# Patient Record
Sex: Male | Born: 1960 | Race: White | Hispanic: No | Marital: Married | Smoking: Never smoker
Health system: Southern US, Community
[De-identification: ages and names within clinical notes are randomized; demographics above are authoritative.]

## PROBLEM LIST (undated history)

## (undated) DIAGNOSIS — R112 Nausea with vomiting, unspecified: Secondary | ICD-10-CM

## (undated) DIAGNOSIS — Z9889 Other specified postprocedural states: Secondary | ICD-10-CM

## (undated) DIAGNOSIS — E119 Type 2 diabetes mellitus without complications: Secondary | ICD-10-CM

## (undated) DIAGNOSIS — I2510 Atherosclerotic heart disease of native coronary artery without angina pectoris: Secondary | ICD-10-CM

## (undated) DIAGNOSIS — I10 Essential (primary) hypertension: Secondary | ICD-10-CM

## (undated) DIAGNOSIS — E7800 Pure hypercholesterolemia, unspecified: Secondary | ICD-10-CM

## (undated) HISTORY — PX: CHOLECYSTECTOMY: SHX55

## (undated) HISTORY — PX: BACK SURGERY: SHX140

---

## 1997-09-28 ENCOUNTER — Ambulatory Visit (HOSPITAL_COMMUNITY): Admission: RE | Admit: 1997-09-28 | Discharge: 1997-09-28 | Payer: Self-pay | Admitting: Neurosurgery

## 1998-05-29 ENCOUNTER — Inpatient Hospital Stay (HOSPITAL_COMMUNITY): Admission: AD | Admit: 1998-05-29 | Discharge: 1998-05-30 | Payer: Self-pay | Admitting: Cardiology

## 1998-05-30 ENCOUNTER — Encounter: Payer: Self-pay | Admitting: Cardiology

## 1999-03-25 ENCOUNTER — Encounter: Admission: RE | Admit: 1999-03-25 | Discharge: 1999-03-25 | Payer: Self-pay | Admitting: Urology

## 1999-03-25 ENCOUNTER — Encounter: Payer: Self-pay | Admitting: Urology

## 2002-10-24 ENCOUNTER — Ambulatory Visit (HOSPITAL_COMMUNITY): Admission: RE | Admit: 2002-10-24 | Discharge: 2002-10-24 | Payer: Self-pay | Admitting: Internal Medicine

## 2002-10-24 ENCOUNTER — Encounter: Payer: Self-pay | Admitting: Internal Medicine

## 2002-10-31 ENCOUNTER — Ambulatory Visit (HOSPITAL_COMMUNITY): Admission: RE | Admit: 2002-10-31 | Discharge: 2002-10-31 | Payer: Self-pay | Admitting: Internal Medicine

## 2004-03-06 ENCOUNTER — Ambulatory Visit: Payer: Self-pay | Admitting: Family Medicine

## 2004-03-18 ENCOUNTER — Ambulatory Visit: Payer: Self-pay | Admitting: Gastroenterology

## 2004-03-21 ENCOUNTER — Ambulatory Visit: Payer: Self-pay | Admitting: Gastroenterology

## 2004-04-08 ENCOUNTER — Ambulatory Visit: Payer: Self-pay | Admitting: Family Medicine

## 2004-06-10 ENCOUNTER — Ambulatory Visit: Payer: Self-pay | Admitting: Family Medicine

## 2004-07-24 ENCOUNTER — Ambulatory Visit: Payer: Self-pay | Admitting: Family Medicine

## 2004-09-04 ENCOUNTER — Ambulatory Visit: Payer: Self-pay | Admitting: Family Medicine

## 2004-09-05 ENCOUNTER — Ambulatory Visit (HOSPITAL_COMMUNITY): Admission: RE | Admit: 2004-09-05 | Discharge: 2004-09-05 | Payer: Self-pay | Admitting: Urology

## 2004-09-22 ENCOUNTER — Ambulatory Visit: Payer: Self-pay | Admitting: Family Medicine

## 2004-11-17 ENCOUNTER — Ambulatory Visit: Payer: Self-pay | Admitting: Family Medicine

## 2005-01-19 ENCOUNTER — Ambulatory Visit: Payer: Self-pay | Admitting: Family Medicine

## 2005-04-09 ENCOUNTER — Ambulatory Visit: Payer: Self-pay | Admitting: Family Medicine

## 2005-04-13 ENCOUNTER — Ambulatory Visit: Payer: Self-pay | Admitting: Family Medicine

## 2005-05-20 ENCOUNTER — Ambulatory Visit: Payer: Self-pay | Admitting: Family Medicine

## 2005-07-22 ENCOUNTER — Ambulatory Visit: Payer: Self-pay | Admitting: Family Medicine

## 2005-09-16 ENCOUNTER — Ambulatory Visit: Payer: Self-pay | Admitting: Family Medicine

## 2005-11-03 ENCOUNTER — Ambulatory Visit: Payer: Self-pay | Admitting: Family Medicine

## 2006-02-15 ENCOUNTER — Ambulatory Visit: Payer: Self-pay | Admitting: Family Medicine

## 2006-03-11 ENCOUNTER — Ambulatory Visit: Payer: Self-pay | Admitting: Family Medicine

## 2006-04-12 ENCOUNTER — Ambulatory Visit: Payer: Self-pay | Admitting: Family Medicine

## 2006-05-31 ENCOUNTER — Ambulatory Visit: Payer: Self-pay | Admitting: Family Medicine

## 2009-07-02 ENCOUNTER — Emergency Department (HOSPITAL_COMMUNITY): Admission: EM | Admit: 2009-07-02 | Discharge: 2009-07-02 | Payer: Self-pay | Admitting: Emergency Medicine

## 2010-07-27 LAB — URINALYSIS, ROUTINE W REFLEX MICROSCOPIC
Bilirubin Urine: NEGATIVE
Glucose, UA: NEGATIVE mg/dL
Ketones, ur: NEGATIVE mg/dL
Leukocytes, UA: NEGATIVE
Nitrite: NEGATIVE
pH: 7 (ref 5.0–8.0)

## 2010-07-27 LAB — DIFFERENTIAL
Basophils Absolute: 0 10*3/uL (ref 0.0–0.1)
Basophils Relative: 0 % (ref 0–1)
Lymphocytes Relative: 14 % (ref 12–46)
Monocytes Absolute: 0.5 10*3/uL (ref 0.1–1.0)
Neutro Abs: 6.8 10*3/uL (ref 1.7–7.7)
Neutrophils Relative %: 79 % — ABNORMAL HIGH (ref 43–77)

## 2010-07-27 LAB — COMPREHENSIVE METABOLIC PANEL
ALT: 25 U/L (ref 0–53)
AST: 27 U/L (ref 0–37)
Albumin: 4.2 g/dL (ref 3.5–5.2)
CO2: 28 mEq/L (ref 19–32)
Creatinine, Ser: 0.92 mg/dL (ref 0.4–1.5)
GFR calc Af Amer: >60 mL/min (ref >60)
Glucose, Bld: 181 mg/dL — ABNORMAL HIGH (ref 70–99)
Total Bilirubin: 1.3 mg/dL — ABNORMAL HIGH (ref 0.3–1.2)

## 2010-07-27 LAB — URINE MICROSCOPIC-ADD ON

## 2010-07-27 LAB — CBC
HCT: 44.7 % (ref 39.0–52.0)
Hemoglobin: 15.4 g/dL (ref 13.0–17.0)
RBC: 4.76 MIL/uL (ref 4.22–5.81)
WBC: 8.6 10*3/uL (ref 4.0–10.5)

## 2010-09-19 NOTE — Op Note (Signed)
NAME:  Kerry Pollard, Kerry Pollard                          ACCOUNT NO.:  [id number]   MEDICAL RECORD NO.:  [id number]                   PATIENT TYPE:  AMB   LOCATION:  DAY                                  FACILITY:  [facility]   PHYSICIAN:  R. Roetta Sessions, M.D.              DATE OF BIRTH:  January 30, 1961   DATE OF PROCEDURE:  10/31/2002  DATE OF DISCHARGE:                                 OPERATIVE REPORT   PROCEDURE:  Esophagogastroduodenoscopy with biopsy.   INDICATIONS FOR PROCEDURE:  The patient is a 50 year old gentleman with  right upper quadrant abdominal pain x1 year with some exacerbation by  movement.  Moderately elevated SGPT recently.  Ultrasound demonstrated a  fatty appearing liver.  No other hepatobiliary abnormalities.  Iron studies  showed ferratin to be within normal limits.  Hepatitis C and B markers  negative.  EGD is now being done to further evaluate his symptoms.  This  approach has been discussed with the patient at length previously.  The  potential risks, benefits, and alternatives have been reviewed.  Please see  my dictated 10/23/2002 office note attached to the chart.   PROCEDURE:  O2 saturation, blood pressure, pulses, and respirations were  monitored throughout the entire procedure.  Conscious sedation was with  Versed 4 mg IV, Demerol 100 mg IV in divided doses.  The instrument used was  the Olympus video chip adult gastroscope, Cetacaine spray for topical  oropharyngeal anesthesia.   FINDINGS:  Examination of the tubular esophagus revealed no abnormalities.  The EG junction was somewhat patulous and easily traversed.   Stomach:  The gastric cavity was empty and insufflated well with air.  Thorough examination of the gastric mucosa including retroflex view in the  proximal stomach and esophagogastric junction demonstrated a small hiatal  hernia and multiple antral erosions.  There was no obvious infiltrative  process or frank ulcer.  The pylorus was patent and easily  traversed.   Duodenum:  Examination of the bulb and second portion revealed no  abnormalities.   THERAPEUTIC/DIAGNOSTIC MANEUVERS PERFORMED:  Antral biopsies for histologic  study were taken.  The patient tolerated the procedure well and was reactive  in endoscopy.   IMPRESSION:  1. A somewhat patulous esophagogastric junction and normal esophageal     mucosa.  2. Small hiatal hernia.  3. Multiple antral erosions, as described above, biopsied.  The remainder of     the gastric mucosa appeared normal.  4. Normal first and second portions of the duodenum.  5. Today's findings may be secondary to aspirin effect.  He has had chronic     symptoms for some time and are somewhat atypical in nature.   RECOMMENDATIONS:  1. I will ask him to stop the Nexium and give him a three-week sample supply     of Aciphex 20 mg orally once daily (just to see if this makes a  difference in the abdominal pain).  2.     Draw H pylori serologies.  3. Follow up in the office in three to four weeks.  4. If his abdominal pain is persisting at his next office visit, will     consider gallbladder evaluation via HIDA scan.                                               [name], M.D.    RMR/MEDQ  D:  10/31/2002  T:  10/31/2002  Job:  [id number]   cc:   Delaney Meigs, M.D.  [address]  [place]  [place] [address]  Fax: [phone]

## 2010-10-03 IMAGING — CR DG CERVICAL SPINE COMPLETE 4+V
7 series · 7 of 7 positions shown · non-contrast
Comparison: None.

CLINICAL DATA: History of trauma and pain

CERVICAL SPINE - COMPLETE 4+ VIEW

[view not recorded (1 of 7)]
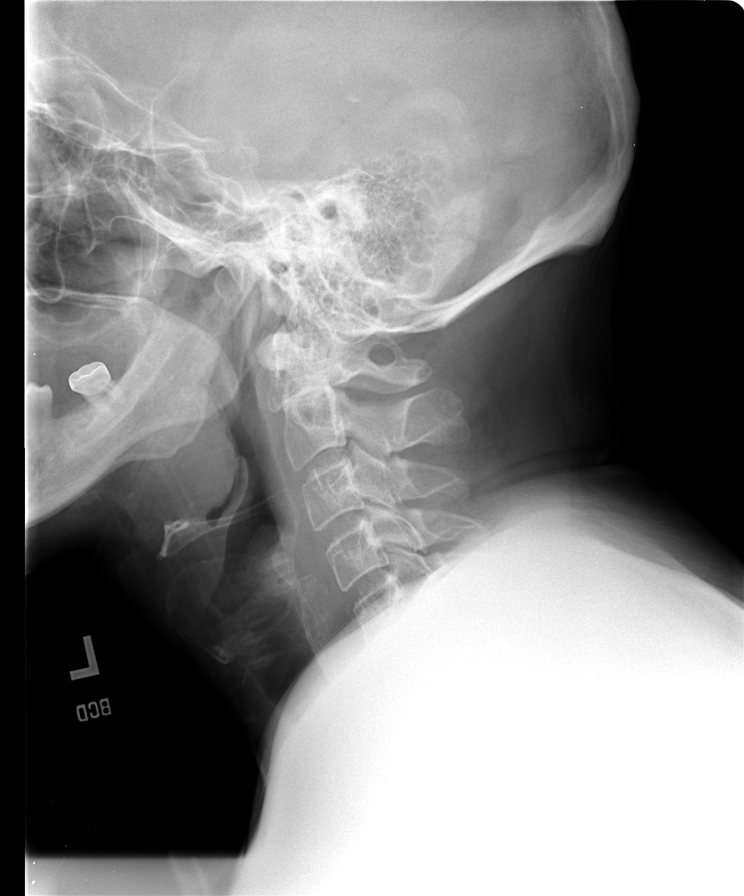

[view not recorded (2 of 7)]
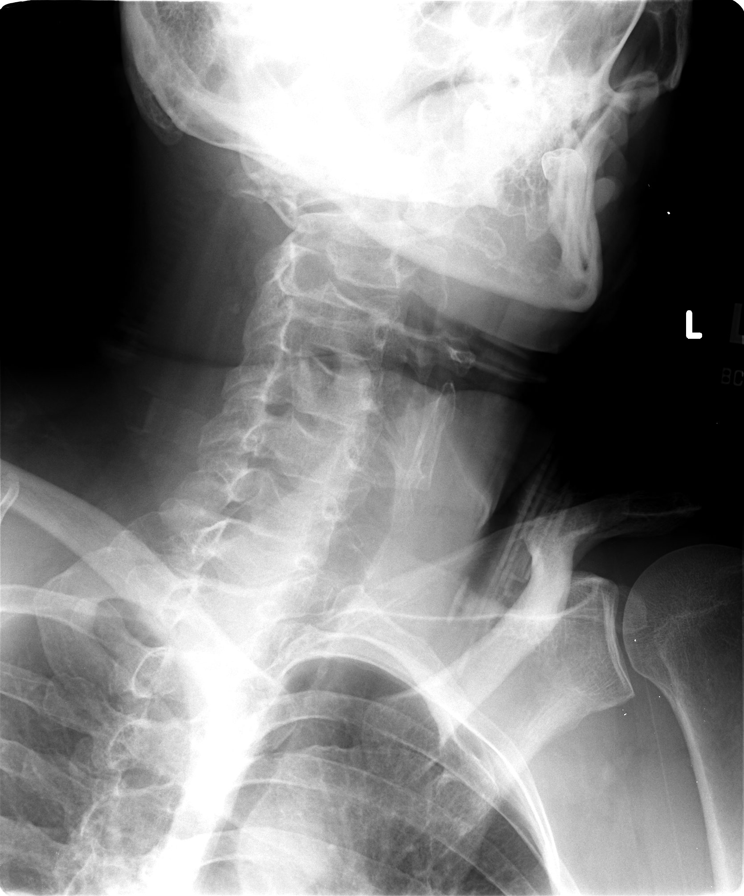

[view not recorded (3 of 7)]
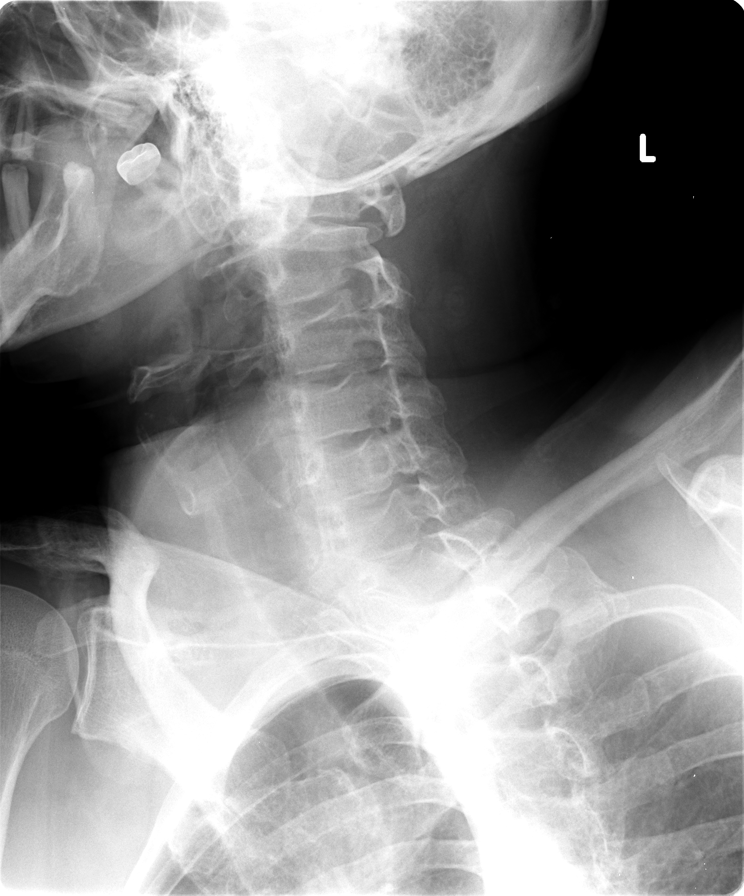

[view not recorded (4 of 7)]
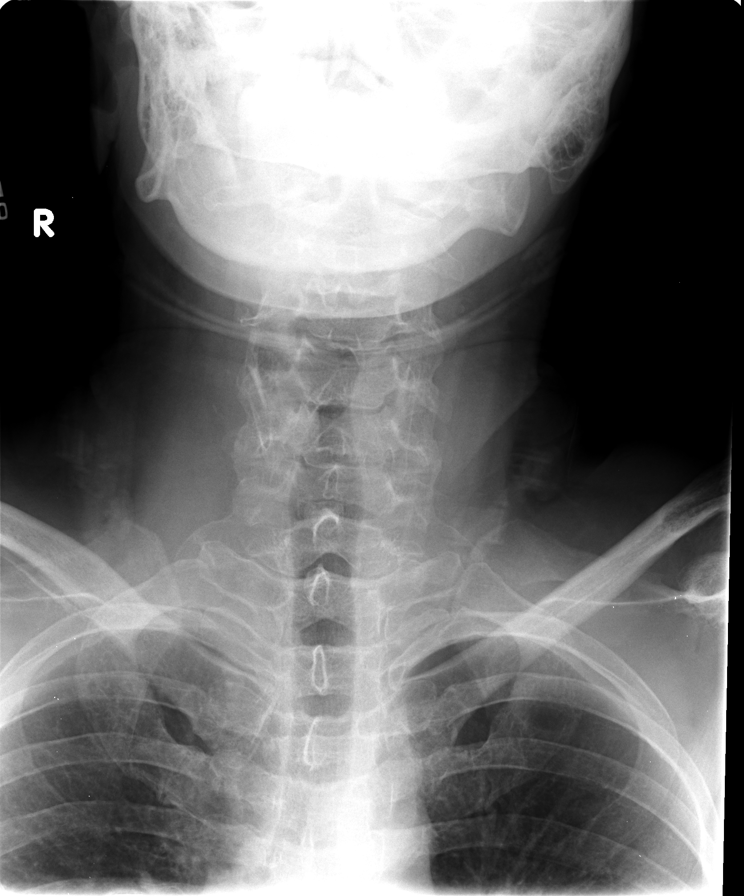

[view not recorded (5 of 7)]
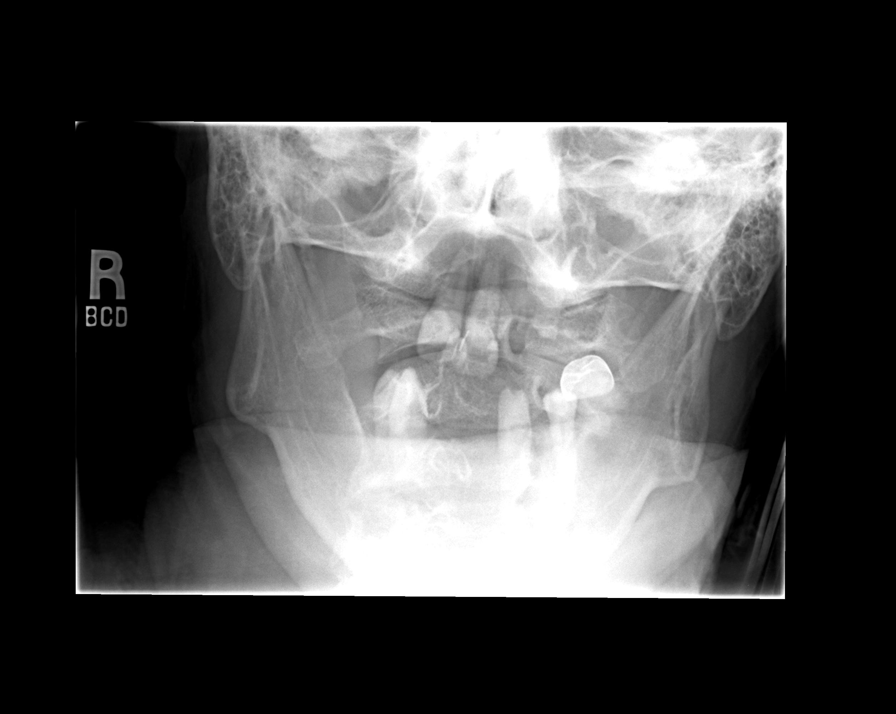

[view not recorded (6 of 7)]
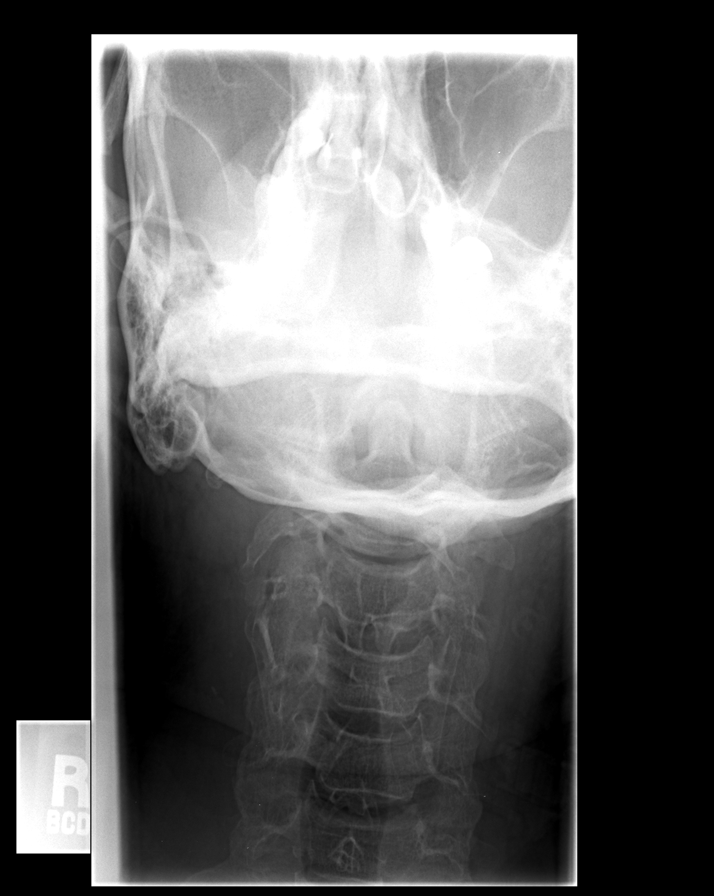

[view not recorded (7 of 7)]
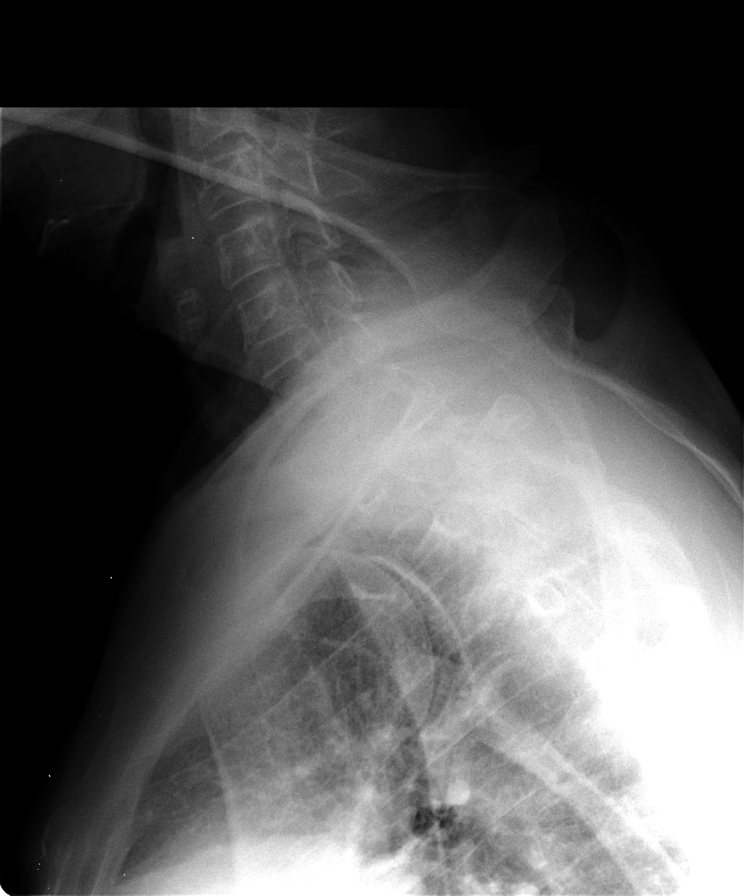

[7 of 7 positions shown; findings below may reference images not displayed]

FINDINGS: No prevertebral soft tissue swelling is evident.
Intervertebral disc spaces are maintained.  No fracture or
subluxation or spondylosis is seen.  Foramina appear patent.
IMPRESSION: No cervical trauma is evident.

## 2012-02-12 ENCOUNTER — Emergency Department (HOSPITAL_COMMUNITY)
Admission: EM | Admit: 2012-02-12 | Discharge: 2012-02-12 | Disposition: A | Discharge status: Home / Self Care | Payer: BC Managed Care – PPO | Attending: Emergency Medicine | Admitting: Emergency Medicine

## 2012-02-12 ENCOUNTER — Encounter (HOSPITAL_COMMUNITY): Payer: Self-pay | Admitting: Emergency Medicine

## 2012-02-12 DIAGNOSIS — H81399 Other peripheral vertigo, unspecified ear: Secondary | ICD-10-CM | POA: Insufficient documentation

## 2012-02-12 DIAGNOSIS — Z886 Allergy status to analgesic agent status: Secondary | ICD-10-CM | POA: Insufficient documentation

## 2012-02-12 DIAGNOSIS — R739 Hyperglycemia, unspecified: Secondary | ICD-10-CM

## 2012-02-12 DIAGNOSIS — E7800 Pure hypercholesterolemia, unspecified: Secondary | ICD-10-CM | POA: Insufficient documentation

## 2012-02-12 DIAGNOSIS — I10 Essential (primary) hypertension: Secondary | ICD-10-CM | POA: Insufficient documentation

## 2012-02-12 DIAGNOSIS — Z880 Allergy status to penicillin: Secondary | ICD-10-CM | POA: Insufficient documentation

## 2012-02-12 DIAGNOSIS — I2510 Atherosclerotic heart disease of native coronary artery without angina pectoris: Secondary | ICD-10-CM | POA: Insufficient documentation

## 2012-02-12 DIAGNOSIS — E119 Type 2 diabetes mellitus without complications: Secondary | ICD-10-CM | POA: Insufficient documentation

## 2012-02-12 HISTORY — DX: Pure hypercholesterolemia, unspecified: E78.00

## 2012-02-12 HISTORY — DX: Type 2 diabetes mellitus without complications: E11.9

## 2012-02-12 HISTORY — DX: Essential (primary) hypertension: I10

## 2012-02-12 HISTORY — DX: Atherosclerotic heart disease of native coronary artery without angina pectoris: I25.10

## 2012-02-12 LAB — CBC WITH DIFFERENTIAL/PLATELET
HCT: 45.3 % (ref 39.0–52.0)
Hemoglobin: 16.4 g/dL (ref 13.0–17.0)
Lymphocytes Relative: 17 % (ref 12–46)
Lymphs Abs: 1.6 10*3/uL (ref 0.7–4.0)
MCHC: 36.2 g/dL — ABNORMAL HIGH (ref 30.0–36.0)
MCV: 88 fL (ref 78.0–100.0)
Monocytes Absolute: 0.3 10*3/uL (ref 0.1–1.0)
Neutrophils Relative %: 79 % — ABNORMAL HIGH (ref 43–77)
Platelets: 279 10*3/uL (ref 150–400)
RBC: 5.15 MIL/uL (ref 4.22–5.81)
WBC: 9.3 10*3/uL (ref 4.0–10.5)

## 2012-02-12 LAB — GLUCOSE, CAPILLARY: Glucose-Capillary: 365 mg/dL — ABNORMAL HIGH (ref 70–99)

## 2012-02-12 LAB — BASIC METABOLIC PANEL
BUN: 12 mg/dL (ref 6–23)
CO2: 27 mEq/L (ref 19–32)
Calcium: 10.5 mg/dL (ref 8.4–10.5)
Creatinine, Ser: 0.81 mg/dL (ref 0.50–1.35)
GFR calc non Af Amer: >90 mL/min (ref >90)
Glucose, Bld: 343 mg/dL — ABNORMAL HIGH (ref 70–99)
Sodium: 133 mEq/L — ABNORMAL LOW (ref 135–145)

## 2012-02-12 MED ORDER — SODIUM CHLORIDE 0.9 % IV BOLUS (SEPSIS)
1000.0000 mL | Freq: Once | INTRAVENOUS | Status: AC
  Administered 2012-02-12 (×2): 1000 mL via INTRAVENOUS

## 2012-02-12 MED ORDER — MECLIZINE HCL 25 MG PO TABS: 25.0000 mg | ORAL_TABLET | Freq: Three times a day (TID) | ORAL | Status: AC | PRN

## 2012-02-12 MED ORDER — MECLIZINE HCL 12.5 MG PO TABS
25.0000 mg | ORAL_TABLET | Freq: Once | ORAL | Status: AC
  Administered 2012-02-12: 25 mg via ORAL
  Filled 2012-02-12: qty 2

## 2012-02-12 MED ORDER — ONDANSETRON HCL 4 MG/2ML IJ SOLN
4.0000 mg | Freq: Once | INTRAMUSCULAR | Status: AC
  Administered 2012-02-12: 4 mg via INTRAVENOUS
  Filled 2012-02-12: qty 2

## 2012-02-12 MED ORDER — SODIUM CHLORIDE 0.9 % IV SOLN
Freq: Once | INTRAVENOUS | Status: AC
  Administered 2012-02-12: 1000 mL via INTRAVENOUS

## 2012-02-12 NOTE — ED Notes (Signed)
Pt woke around 4am with n/v. Went to see pcp and bs checked at office and was 358. 365 now. Pt c/o dizziness also. Pt c/o h/a. Alert/oriented. gen weakness observed. Mm slightly wet.

## 2012-02-12 NOTE — ED Provider Notes (Signed)
History    This chart was scribed for Kerry Booze, MD, MD by Smitty Pluck. The patient was seen in room [facility]10 and the patient's care was started at 10:47AM.   CSN: [id number]  Arrival date & time 02/12/12  1008      Chief Complaint  Patient presents with  . Hyperglycemia  . Emesis    (Consider location/radiation/quality/duration/timing/severity/associated sxs/prior treatment) Patient is a 51 y.o. male presenting with vomiting. The history is provided by the patient. No language interpreter was used.  Emesis    Kerry Pollard is a 51 y.o. male who presents to the Emergency Department complaining of constant, moderate nausea, emesis and dizziness onset today this AM. Pt reports that he went to PCP PTA and was told he had elevated glucose of 358 and DKA. He reports movement aggravates his dizziness. He reports he check his glucose at home yesterday and it was 270. He reports having headache. Denies trouble hearing, ringing in ears, smoking and drinking.  Past Medical History  Diagnosis Date  . Diabetes mellitus without complication   . Coronary artery disease   . Hypertension   . Hypercholesterolemia     Past Surgical History  Procedure Date  . Cholecystectomy   . Back surgery     History reviewed. No pertinent family history.  History  Substance Use Topics  . Smoking status: Never Smoker   . Smokeless tobacco: Not on file  . Alcohol Use: No      Review of Systems  Gastrointestinal: Positive for vomiting.  All other systems reviewed and are negative.    Allergies  Codeine and Penicillins  Home Medications   Current Outpatient Rx  Name Route Sig Dispense Refill  . ASPIRIN 81 MG PO CHEW Oral Chew 81 mg by mouth daily.    . ATENOLOL 25 MG PO TABS Oral Take 25 mg by mouth daily.    Marland Kitchen ESOMEPRAZOLE MAGNESIUM 40 MG PO CPDR Oral Take 40 mg by mouth daily before breakfast.    . GABAPENTIN 100 MG PO CAPS Oral Take 100 mg by mouth 3 (three) times daily.    Marland Kitchen  GLIPIZIDE ER 10 MG PO TB24 Oral Take 10 mg by mouth daily.    Marland Kitchen HYDROCHLOROTHIAZIDE 25 MG PO TABS Oral Take 25 mg by mouth daily.    . INSULIN DETEMIR 100 UNIT/ML [place] SOLN Subcutaneous Inject 75 Units into the skin at bedtime.    Marland Kitchen METFORMIN HCL 1000 MG PO TABS Oral Take 1,000 mg by mouth 2 (two) times daily with a meal. Take 1000 mg in the morning and 2000 mg at night    . RAMIPRIL 10 MG PO CAPS Oral Take 10 mg by mouth daily.    Marland Kitchen ROSUVASTATIN CALCIUM 10 MG PO TABS Oral Take 10 mg by mouth daily.      BP 151/82  Pulse 67  Temp 97.3 F (36.3 C) (Oral)  Resp 18  SpO2 94%  Physical Exam  Nursing note and vitals reviewed. Constitutional: He is oriented to person, place, and time. He appears well-developed and well-nourished. No distress.  HENT:  Head: Normocephalic and atraumatic.  Eyes: EOM are normal. Pupils are equal, round, and reactive to light.  Neck: Normal range of motion. Neck supple. No tracheal deviation present.  Cardiovascular: Normal rate.   Pulmonary/Chest: Effort normal. No respiratory distress.  Abdominal: Soft. He exhibits no distension.  Musculoskeletal: Normal range of motion.  Neurological: He is alert and oriented to person, place, and time.  No nystagmus  Dizziness reproduced by head movement   Skin: Skin is warm and dry.  Psychiatric: He has a normal mood and affect. His behavior is normal.    ED Course  Procedures (including critical care time) DIAGNOSTIC STUDIES: Oxygen Saturation is 94% on room air, normal by my interpretation.    COORDINATION OF CARE: 10:50 AM Discussed ED treatment with pt  11:40 AM Ordered:  zofran 4 mg, anitvert 25 mg, 0.9% NaCl infusion   Results for orders placed during the hospital encounter of 02/12/12  CBC WITH DIFFERENTIAL      Component Value Range   WBC 9.3  4.0 - 10.5 K/uL   RBC 5.15  4.22 - 5.81 MIL/uL   Hemoglobin 16.4  13.0 - 17.0 g/dL   HCT 86.5  78.4 - 69.6 %   MCV 88.0  78.0 - 100.0 fL   MCH 31.8   26.0 - 34.0 pg   MCHC 36.2 (*) 30.0 - 36.0 g/dL   RDW 29.5  28.4 - 13.2 %   Platelets 279  150 - 400 K/uL   Neutrophils Relative 79 (*) 43 - 77 %   Neutro Abs 7.4  1.7 - 7.7 K/uL   Lymphocytes Relative 17  12 - 46 %   Lymphs Abs 1.6  0.7 - 4.0 K/uL   Monocytes Relative 3  3 - 12 %   Monocytes Absolute 0.3  0.1 - 1.0 K/uL   Eosinophils Relative 0  0 - 5 %   Eosinophils Absolute 0.0  0.0 - 0.7 K/uL   Basophils Relative 0  0 - 1 %   Basophils Absolute 0.0  0.0 - 0.1 K/uL  BASIC METABOLIC PANEL      Component Value Range   Sodium 133 (*) 135 - 145 mEq/L   Potassium 4.1  3.5 - 5.1 mEq/L   Chloride 94 (*) 96 - 112 mEq/L   CO2 27  19 - 32 mEq/L   Glucose, Bld 343 (*) 70 - 99 mg/dL   BUN 12  6 - 23 mg/dL   Creatinine, Ser 4.40  0.50 - 1.35 mg/dL   Calcium 10.2  8.4 - 72.5 mg/dL   GFR calc non Af Amer >90  >90 mL/min   GFR calc Af Amer >90  >90 mL/min  GLUCOSE, CAPILLARY      Component Value Range   Glucose-Capillary 365 (*) 70 - 99 mg/dL   Comment 1 Documented in Chart     Comment 2 Notify RN    GLUCOSE, CAPILLARY      Component Value Range   Glucose-Capillary 275 (*) 70 - 99 mg/dL     1. Peripheral vertigo   2. Hyperglycemia       MDM  Dizziness which seems to be peripheral vertigo. Hyperglycemia is relatively mild and I do not see any clinical evidence of ketoacidosis. He will be given IV hydration and electrolytes checked. Initial dose of meclizine is given.  He got good relief of dizziness with meclizine. Blood sugars come down to Tuesday 175 without exogenous insulin. He will be sent home with a prescription for meclizine.   I personally performed the services described in this documentation, which was scribed in my presence. The recorded information has been reviewed and considered.        Kerry Booze, MD 02/12/12 1228

## 2013-09-07 ENCOUNTER — Telehealth: Payer: Self-pay | Admitting: Internal Medicine

## 2013-09-07 NOTE — Telephone Encounter (Signed)
Pt called to set up his colonoscopy. Last one was in 2004. Patient can be reached after 4pm. [phone]

## 2013-09-12 ENCOUNTER — Other Ambulatory Visit: Payer: Self-pay

## 2013-09-12 DIAGNOSIS — Z1211 Encounter for screening for malignant neoplasm of colon: Secondary | ICD-10-CM

## 2013-09-12 NOTE — Telephone Encounter (Signed)
Day of bowel prep: Invokana 150mg  in AM, glipizide 5mg  daily, metformin 250mg  in am and 500mg  pm.

## 2013-09-12 NOTE — Telephone Encounter (Signed)
Gastroenterology Pre-Procedure Review  Request Date: 09/12/2013 Requesting Physician:   PATIENT REVIEW QUESTIONS: The patient responded to the following health history questions as indicated:    1. Diabetes Melitis: YES 2. Joint replacements in the past 12 months: no 3. Major health problems in the past 3 months: no 4. Has an artificial valve or MVP: no 5. Has a defibrillator: no 6. Has been advised in past to take antibiotics in advance of a procedure like teeth cleaning: no    MEDICATIONS & ALLERGIES:    Patient reports the following regarding taking any blood thinners:   Plavix? no Aspirin? YES Coumadin? no  Patient confirms/reports the following medications:  Current Outpatient Prescriptions  Medication Sig Dispense Refill  . aspirin 81 MG chewable tablet Chew 81 mg by mouth daily.      Marland Kitchen atenolol (TENORMIN) 25 MG tablet Take 25 mg by mouth daily.      . Canagliflozin (INVOKANA) 300 MG TABS Take 300 mg by mouth daily. Once daily in the AM      . esomeprazole (NEXIUM) 40 MG capsule Take 40 mg by mouth daily before breakfast.      . gabapentin (NEURONTIN) 100 MG capsule Take 100 mg by mouth 3 (three) times daily.      Marland Kitchen glipiZIDE (GLUCOTROL XL) 10 MG 24 hr tablet Take 10 mg by mouth daily.      . hydrochlorothiazide (HYDRODIURIL) 25 MG tablet Take 25 mg by mouth daily.      . meclizine (ANTIVERT) 25 MG tablet Take 1 tablet (25 mg total) by mouth 3 (three) times daily as needed.  30 tablet  0  . metFORMIN (GLUCOPHAGE) 500 MG tablet Take by mouth 2 (two) times daily with a meal. Pt takes one in the AM and two in the PM      . rosuvastatin (CRESTOR) 10 MG tablet Take 10 mg by mouth daily.      . ramipril (ALTACE) 10 MG capsule Take 10 mg by mouth daily.       No current facility-administered medications for this visit.    Patient confirms/reports the following allergies:  Allergies  Allergen Reactions  . Codeine   . Penicillins     No orders of the defined types were placed  in this encounter.    AUTHORIZATION INFORMATION Primary Insurance:   ID #:   Group #:  Pre-Cert / Auth required:  Pre-Cert / Auth #:   Secondary Insurance:   ID #:   Group #:  Pre-Cert / Auth required:  Pre-Cert / Auth #:   SCHEDULE INFORMATION: Procedure has been scheduled as follows:  Date:  10/16/2013             Time:  8:30 AM Location: [facility] Short Stay  This Gastroenterology Pre-Precedure Review Form is being routed to the following provider(s): R. Garfield Cornea, MD

## 2013-09-14 MED ORDER — PEG-KCL-NACL-NASULF-NA ASC-C 100 G PO SOLR: 1.0000 | ORAL | Status: AC

## 2013-09-14 NOTE — Telephone Encounter (Signed)
Rx sent to the pharmacy and instructions mailed to pt.  

## 2013-09-29 ENCOUNTER — Telehealth: Payer: Self-pay

## 2013-09-29 NOTE — Telephone Encounter (Signed)
I called and spoke to lady. She will have pt call me to update triage info. She said he did receive the info in the mail for his procedure and is aware how to do the diabetic meds.

## 2013-10-02 ENCOUNTER — Encounter (HOSPITAL_COMMUNITY): Payer: Self-pay | Admitting: Pharmacy Technician

## 2013-10-03 NOTE — Telephone Encounter (Signed)
Pt called and he has not had any change in his medications and no new medical problems. We reviewed his diabetic meds. He will call if he has questions.

## 2013-10-03 NOTE — Telephone Encounter (Signed)
Looks like on synthroid now? OK to schedule as per previous triage instructions.

## 2013-10-04 NOTE — Telephone Encounter (Signed)
Great. I already updated med list.

## 2013-10-04 NOTE — Telephone Encounter (Signed)
I called and spoke to pt's wife. She said he has been on the Synthroid for several years. Evidently it was overlooked when triaged.  No new meds added.

## 2013-10-05 NOTE — Telephone Encounter (Signed)
Spoke with pt's wife today. Pt had wanted to change the procedure til after 10/16/2013, but wanted the week that Dr.Rourk is on vacation.  He is checking his schedule and will let us know if he wants to change to another week.  Pt's wife said leave him on for 10/16/2013 for now.

## 2013-10-05 NOTE — Telephone Encounter (Signed)
Pt's wife called to speak with Kerry Pollard. Please advise [phone]

## 2013-10-16 ENCOUNTER — Encounter (HOSPITAL_COMMUNITY): Payer: Self-pay

## 2013-10-16 ENCOUNTER — Encounter (HOSPITAL_COMMUNITY)
Admission: RE | Disposition: A | Discharge status: Home / Self Care | Payer: Self-pay | Source: Ambulatory Visit | Attending: Internal Medicine

## 2013-10-16 ENCOUNTER — Ambulatory Visit (HOSPITAL_COMMUNITY)
Admission: RE | Admit: 2013-10-16 | Discharge: 2013-10-16 | Disposition: A | Discharge status: Home / Self Care | Payer: BC Managed Care – PPO | Source: Ambulatory Visit | Attending: Internal Medicine | Admitting: Internal Medicine

## 2013-10-16 DIAGNOSIS — Z7982 Long term (current) use of aspirin: Secondary | ICD-10-CM | POA: Insufficient documentation

## 2013-10-16 DIAGNOSIS — Z79899 Other long term (current) drug therapy: Secondary | ICD-10-CM | POA: Insufficient documentation

## 2013-10-16 DIAGNOSIS — Z86010 Personal history of colonic polyps: Secondary | ICD-10-CM

## 2013-10-16 DIAGNOSIS — E7800 Pure hypercholesterolemia, unspecified: Secondary | ICD-10-CM | POA: Insufficient documentation

## 2013-10-16 DIAGNOSIS — K5731 Diverticulosis of large intestine without perforation or abscess with bleeding: Secondary | ICD-10-CM

## 2013-10-16 DIAGNOSIS — Z880 Allergy status to penicillin: Secondary | ICD-10-CM | POA: Insufficient documentation

## 2013-10-16 DIAGNOSIS — K5730 Diverticulosis of large intestine without perforation or abscess without bleeding: Secondary | ICD-10-CM | POA: Insufficient documentation

## 2013-10-16 DIAGNOSIS — Z1211 Encounter for screening for malignant neoplasm of colon: Secondary | ICD-10-CM | POA: Insufficient documentation

## 2013-10-16 DIAGNOSIS — E119 Type 2 diabetes mellitus without complications: Secondary | ICD-10-CM | POA: Insufficient documentation

## 2013-10-16 DIAGNOSIS — I10 Essential (primary) hypertension: Secondary | ICD-10-CM | POA: Insufficient documentation

## 2013-10-16 DIAGNOSIS — D126 Benign neoplasm of colon, unspecified: Secondary | ICD-10-CM | POA: Insufficient documentation

## 2013-10-16 DIAGNOSIS — Z885 Allergy status to narcotic agent status: Secondary | ICD-10-CM | POA: Insufficient documentation

## 2013-10-16 DIAGNOSIS — I2510 Atherosclerotic heart disease of native coronary artery without angina pectoris: Secondary | ICD-10-CM | POA: Insufficient documentation

## 2013-10-16 HISTORY — DX: Nausea with vomiting, unspecified: R11.2

## 2013-10-16 HISTORY — DX: Other specified postprocedural states: Z98.890

## 2013-10-16 HISTORY — PX: COLONOSCOPY: SHX5424

## 2013-10-16 LAB — GLUCOSE, CAPILLARY: Glucose-Capillary: 139 mg/dL — ABNORMAL HIGH (ref 70–99)

## 2013-10-16 SURGERY — COLONOSCOPY
Anesthesia: Moderate Sedation

## 2013-10-16 MED ORDER — MIDAZOLAM HCL 5 MG/5ML IJ SOLN
INTRAMUSCULAR | Status: DC | PRN
  Administered 2013-10-16: 2 mg via INTRAVENOUS
  Administered 2013-10-16: 1 mg via INTRAVENOUS
  Administered 2013-10-16: 2 mg via INTRAVENOUS

## 2013-10-16 MED ORDER — SIMETHICONE 40 MG/0.6ML PO SUSP
ORAL | Status: DC | PRN
  Administered 2013-10-16: 09:00:00

## 2013-10-16 MED ORDER — SODIUM CHLORIDE 0.9 % IV SOLN
INTRAVENOUS | Status: DC
  Administered 2013-10-16: 08:00:00 via INTRAVENOUS

## 2013-10-16 MED ORDER — MIDAZOLAM HCL 5 MG/5ML IJ SOLN
INTRAMUSCULAR | Status: AC
  Filled 2013-10-16: qty 10

## 2013-10-16 MED ORDER — ONDANSETRON HCL 4 MG/2ML IJ SOLN
INTRAMUSCULAR | Status: DC | PRN
  Administered 2013-10-16: 4 mg via INTRAVENOUS

## 2013-10-16 MED ORDER — MEPERIDINE HCL 100 MG/ML IJ SOLN
INTRAMUSCULAR | Status: AC
  Filled 2013-10-16: qty 2

## 2013-10-16 MED ORDER — MEPERIDINE HCL 100 MG/ML IJ SOLN
INTRAMUSCULAR | Status: DC | PRN
  Administered 2013-10-16: 25 mg via INTRAVENOUS
  Administered 2013-10-16: 50 mg via INTRAVENOUS

## 2013-10-16 MED ORDER — ONDANSETRON HCL 4 MG/2ML IJ SOLN
INTRAMUSCULAR | Status: AC
  Filled 2013-10-16: qty 2

## 2013-10-16 NOTE — OR Nursing (Signed)
Pt eating peanut butter crackers and drinking sprite

## 2013-10-16 NOTE — Op Note (Signed)
[facility] [address] [place], [address]   COLONOSCOPY PROCEDURE REPORT  PATIENT: Kerry, Pollard  MR#:         [id number] BIRTHDATE: [birth year] , 52  yrs. old GENDER: Male ENDOSCOPIST: R.  Garfield Cornea, MD FACP FACG REFERRED BY:  Dione Housekeeper, M.D. PROCEDURE DATE:  10/16/2013 PROCEDURE:     Ileocolonoscopy with snare polypectomy  INDICATIONS: First ever average risk colorectal cancer screening examination  INFORMED CONSENT:  The risks, benefits, alternatives and imponderables including but not limited to bleeding, perforation as well as the possibility of a missed lesion have been reviewed.  The potential for biopsy, lesion removal, etc. have also been discussed.  Questions have been answered.  All parties agreeable. Please see the history and physical in the medical record for more information.  MEDICATIONS: Versed 5 mg IV and Demerol 75 mg IV in divided doses Zofran 4 mg IV  DESCRIPTION OF PROCEDURE:  After a digital rectal exam was performed, the EC-[id number] ([id number])  colonoscope was advanced from the anus through the rectum and colon to the area of the cecum, ileocecal valve and appendiceal orifice.  The cecum was deeply intubated.  These structures were well-seen and photographed for the record.  From the level of the cecum and ileocecal valve, the scope was slowly and cautiously withdrawn.  The mucosal surfaces were carefully surveyed utilizing scope tip deflection to facilitate fold flattening as needed.  The scope was pulled down into the rectum where a thorough examination including retroflexion was performed.    FINDINGS:  Adequate preparation. Normal rectum. Few scattered narrow mouth sigmoid diverticula; (1) 4 mm polyp in the mid ascending segment; otherwise, the remainder of the colonic mucosa appeared normal. The distal 5 cm of terminal ileal mucosa also appeared normal.  THERAPEUTIC / DIAGNOSTIC MANEUVERS PERFORMED:  The  above-mentioned polyp was cold snare removed  COMPLICATIONS: none  CECAL WITHDRAWAL TIME:  14 minutes  IMPRESSION:  Colonic diverticulosis. Single colonic polyp-removed as described above.  RECOMMENDATIONS: Followup on pathology.   _______________________________ eSigned:  R. Garfield Cornea, MD FACP [facility] 10/16/2013 9:43 AM   CC:    PATIENT NAME:  Kerry, Pollard MR#: [id number]

## 2013-10-16 NOTE — H&P (Signed)
_0 @   Primary Care Physician:  Sherrie Mustache, MD Primary Gastroenterologist:  Dr. Gala Romney  Pre-Procedure History & Physical: HPI:  Kerry Pollard is a 53 y.o. male is here for a screening colonoscopy. No prior colonoscopy. No bowel symptoms.  No family history of colorectal cancer.  Past Medical History  Diagnosis Date  . Diabetes mellitus without complication   . Coronary artery disease   . Hypertension   . Hypercholesterolemia   . PONV (postoperative nausea and vomiting)     Past Surgical History  Procedure Laterality Date  . Cholecystectomy    . Back surgery      Prior to Admission medications   Medication Sig Start Date End Date Taking? Authorizing Provider  acetaminophen (TYLENOL) 500 MG tablet Take 1,000 mg by mouth daily as needed for headache.   Yes Historical Provider, MD  aspirin 81 MG chewable tablet Chew 81 mg by mouth daily.   Yes Historical Provider, MD  atenolol (TENORMIN) 25 MG tablet Take 25 mg by mouth daily.   Yes Historical Provider, MD  Canagliflozin (INVOKANA) 300 MG TABS Take 300 mg by mouth daily. Once daily in the AM   Yes Historical Provider, MD  esomeprazole (NEXIUM) 40 MG capsule Take 40 mg by mouth daily before breakfast.   Yes Historical Provider, MD  gabapentin (NEURONTIN) 100 MG capsule Take 100 mg by mouth 3 (three) times daily.   Yes Historical Provider, MD  glipiZIDE (GLUCOTROL XL) 10 MG 24 hr tablet Take 10 mg by mouth daily.   Yes Historical Provider, MD  levothyroxine (SYNTHROID, LEVOTHROID) 100 MCG tablet Take 100 mcg by mouth daily before breakfast.   Yes Historical Provider, MD  meclizine (ANTIVERT) 25 MG tablet Take 1 tablet (25 mg total) by mouth 3 (three) times daily as needed. 97/02/63  Yes Delora Fuel, MD  metFORMIN (GLUCOPHAGE-XR) 500 MG 24 hr tablet Take 500-1,000 mg by mouth daily with breakfast. 1 tablet in the morning and 2 tablets at night.   Yes Historical Provider, MD  peg 3350 powder (MOVIPREP) 100 G SOLR Take 1  kit (200 g total) by mouth as directed. 09/14/13  Yes Daneil Dolin, MD  ramipril (ALTACE) 10 MG capsule Take 10 mg by mouth daily.   Yes Historical Provider, MD  rosuvastatin (CRESTOR) 10 MG tablet Take 10 mg by mouth daily.   Yes Historical Provider, MD    Allergies as of 09/12/2013 - Review Complete 09/07/2013  Allergen Reaction Noted  . Codeine  02/12/2012  . Penicillins  02/12/2012    History reviewed. No pertinent family history.  History   Social History  . Marital Status: Married    Spouse Name: N/A    Number of Children: N/A  . Years of Education: N/A   Occupational History  . Not on file.   Social History Main Topics  . Smoking status: Never Smoker   . Smokeless tobacco: Not on file  . Alcohol Use: No  . Drug Use: No  . Sexual Activity: Not on file   Other Topics Concern  . Not on file   Social History Narrative  . No narrative on file    Review of Systems: See HPI, otherwise negative ROS  Physical Exam: BP 130/76  Pulse 72  Temp(Src) 97.4 F (36.3 C) (Oral)  Resp 22  Ht _1  (1.753 m)  Wt 147 lb (66.679 kg)  BMI 21.70 kg/m2  SpO2 97% General:   Alert,  Well-developed, well-nourished, pleasant and cooperative in NAD Head:  Normocephalic and atraumatic. Eyes:  Sclera clear, no icterus.   Conjunctiva pink. Ears:  Normal auditory acuity. Nose:  No deformity, discharge,  or lesions. Mouth:  No deformity or lesions, dentition normal. Neck:  Supple; no masses or thyromegaly. Lungs:  Clear throughout to auscultation.   No wheezes, crackles, or rhonchi. No acute distress. Heart:  Regular rate and rhythm; no murmurs, clicks, rubs,  or gallops. Abdomen:  Soft, nontender and nondistended. No masses, hepatosplenomegaly or hernias noted. Normal bowel sounds, without guarding, and without rebound.   Msk:  Symmetrical without gross deformities. Normal posture. Pulses:  Normal pulses noted. Extremities:  Without clubbing or edema. Neurologic:  Alert and   oriented x4;  grossly normal neurologically. Skin:  Intact without significant lesions or rashes. Cervical Nodes:  No significant cervical adenopathy. Psych:  Alert and cooperative. Normal mood and affect.  Impression/Plan: Kerry Pollard is now here to undergo a screening colonoscopy.  First-ever average risk screening examination. Risks, benefits, limitations, imponderables and alternatives regarding colonoscopy have been reviewed with the patient. Questions have been answered. All parties agreeable.     Notice:  This dictation was prepared with Dragon dictation along with smaller phrase technology. Any transcriptional errors that result from this process are unintentional and may not be corrected upon review.

## 2013-10-16 NOTE — Discharge Instructions (Addendum)
°Colonoscopy °Discharge Instructions ° °Read the instructions outlined below and refer to this sheet in the next few weeks. These discharge instructions provide you with general information on caring for yourself after you leave the hospital. Your doctor may also give you specific instructions. While your treatment has been planned according to the most current medical practices available, unavoidable complications occasionally occur. If you have any problems or questions after discharge, call Dr. Rourk at [phone]. °ACTIVITY °· You may resume your regular activity, but move at a slower pace for the next 24 hours.  °· Take frequent rest periods for the next 24 hours.  °· Walking will help get rid of the air and reduce the bloated feeling in your belly (abdomen).  °· No driving for 24 hours (because of the medicine (anesthesia) used during the test).   °· Do not sign any important legal documents or operate any machinery for 24 hours (because of the anesthesia used during the test).  °NUTRITION °· Drink plenty of fluids.  °· You may resume your normal diet as instructed by your doctor.  °· Begin with a light meal and progress to your normal diet. Heavy or fried foods are harder to digest and may make you feel sick to your stomach (nauseated).  °· Avoid alcoholic beverages for 24 hours or as instructed.  °MEDICATIONS °· You may resume your normal medications unless your doctor tells you otherwise.  °WHAT YOU CAN EXPECT TODAY °· Some feelings of bloating in the abdomen.  °· Passage of more gas than usual.  °· Spotting of blood in your stool or on the toilet paper.  °IF YOU HAD POLYPS REMOVED DURING THE COLONOSCOPY: °· No aspirin products for 7 days or as instructed.  °· No alcohol for 7 days or as instructed.  °· Eat a soft diet for the next 24 hours.  °FINDING OUT THE RESULTS OF YOUR TEST °Not all test results are available during your visit. If your test results are not back during the visit, make an appointment  with your caregiver to find out the results. Do not assume everything is normal if you have not heard from your caregiver or the medical facility. It is important for you to follow up on all of your test results.  °SEEK IMMEDIATE MEDICAL ATTENTION IF: °· You have more than a spotting of blood in your stool.  °· Your belly is swollen (abdominal distention).  °· You are nauseated or vomiting.  °· You have a temperature over 101.  °· You have abdominal pain or discomfort that is severe or gets worse throughout the day.  ° ° ° °Polyp and diverticulosis information provided ° °Further recommendations to follow pending review of pathology report ° °Diverticulosis °Diverticulosis is a common condition that develops when small pouches (diverticula) form in the wall of the colon. The risk of diverticulosis increases with age. It happens more often in people who eat a low-fiber diet. Most individuals with diverticulosis have no symptoms. Those individuals with symptoms usually experience abdominal pain, constipation, or loose stools (diarrhea). °HOME CARE INSTRUCTIONS  °· Increase the amount of fiber in your diet as directed by your caregiver or dietician. This may reduce symptoms of diverticulosis. °· Your caregiver may recommend taking a dietary fiber supplement. °· Drink at least 6 to 8 glasses of water each day to prevent constipation. °· Try not to strain when you have a bowel movement. °· Your caregiver may recommend avoiding nuts and seeds to prevent complications, although this   is still an uncertain benefit. °· Only take over-the-counter or prescription medicines for pain, discomfort, or fever as directed by your caregiver. °FOODS WITH HIGH FIBER CONTENT INCLUDE: °· Fruits. Apple, peach, pear, tangerine, raisins, prunes. °· Vegetables. Brussels sprouts, asparagus, broccoli, cabbage, carrot, cauliflower, romaine lettuce, spinach, summer squash, tomato, winter squash, zucchini. °· Starchy Vegetables. Baked beans, kidney  beans, lima beans, split peas, lentils, potatoes (with skin). °· Grains. Whole wheat bread, brown rice, bran flake cereal, plain oatmeal, white rice, shredded wheat, bran muffins. °SEEK IMMEDIATE MEDICAL CARE IF:  °· You develop increasing pain or severe bloating. °· You have an oral temperature above 102° F (38.9° C), not controlled by medicine. °· You develop vomiting or bowel movements that are bloody or black. °Document Released: 01/16/2004 Document Revised: 07/13/2011 Document Reviewed: 09/18/2009 °[employer]® Patient Information ©2014 [employer]. °Colon Polyps °Polyps are lumps of extra tissue growing inside the body. Polyps can grow in the large intestine (colon). Most colon polyps are noncancerous (benign). However, some colon polyps can become cancerous over time. Polyps that are larger than a pea may be harmful. To be safe, caregivers remove and test all polyps. °CAUSES  °Polyps form when mutations in the genes cause your cells to grow and divide even though no more tissue is needed. °RISK FACTORS °There are a number of risk factors that can increase your chances of getting colon polyps. They include: °· Being older than 50 years. °· Family history of colon polyps or colon cancer. °· Long-term colon diseases, such as colitis or Crohn disease. °· Being overweight. °· Smoking. °· Being inactive. °· Drinking too much alcohol. °SYMPTOMS  °Most small polyps do not cause symptoms. If symptoms are present, they may include: °· Blood in the stool. The stool may look dark red or black. °· Constipation or diarrhea that lasts longer than 1 week. °DIAGNOSIS °People often do not know they have polyps until their caregiver finds them during a regular checkup. Your caregiver can use 4 tests to check for polyps: °· Digital rectal exam. The caregiver wears gloves and feels inside the rectum. This test would find polyps only in the rectum. °· Barium enema. The caregiver puts a liquid called barium into your rectum before  taking X-rays of your colon. Barium makes your colon look white. Polyps are dark, so they are easy to see in the X-ray pictures. °· Sigmoidoscopy. A thin, flexible tube (sigmoidoscope) is placed into your rectum. The sigmoidoscope has a light and tiny camera in it. The caregiver uses the sigmoidoscope to look at the last third of your colon. °· Colonoscopy. This test is like sigmoidoscopy, but the caregiver looks at the entire colon. This is the most common method for finding and removing polyps. °TREATMENT  °Any polyps will be removed during a sigmoidoscopy or colonoscopy. The polyps are then tested for cancer. °PREVENTION  °To help lower your risk of getting more colon polyps: °· Eat plenty of fruits and vegetables. Avoid eating fatty foods. °· Do not smoke. °· Avoid drinking alcohol. °· Exercise every day. °· Lose weight if recommended by your caregiver. °· Eat plenty of calcium and folate. Foods that are rich in calcium include milk, cheese, and broccoli. Foods that are rich in folate include chickpeas, kidney beans, and spinach. °HOME CARE INSTRUCTIONS °Keep all follow-up appointments as directed by your caregiver. You may need periodic exams to check for polyps. °SEEK MEDICAL CARE IF: °You notice bleeding during a bowel movement. °Document Released: 01/15/2004 Document Revised: 07/13/2011 Document   Reviewed: 06/30/2011 °[employer]® Patient Information ©2014 [employer]. ° °

## 2013-10-17 ENCOUNTER — Encounter (HOSPITAL_COMMUNITY): Payer: Self-pay | Admitting: Internal Medicine

## 2013-10-19 ENCOUNTER — Encounter: Payer: Self-pay | Admitting: Internal Medicine

## 2013-10-23 ENCOUNTER — Telehealth: Payer: Self-pay

## 2013-10-23 NOTE — Telephone Encounter (Signed)
More likely co-incidental; let us know if things don't improve

## 2013-10-23 NOTE — Telephone Encounter (Signed)
Pt called- he had his tcs last week, for the last 3-4 days he has had nausea and burning in his stomach (in the area above his navel and just below his ribs). No vomiting, no fever. His BM's are back to normal now and he currently is taking nexium everyday. He is concerned and wanted to know if this was related to his tcs or his diverticulosis. I reviewed the letter that [facility] had sent with the pt and we discussed diverticulosis in detail. I informed him that I would let [facility] know that he was having some issues and call him back tomorrow. Pt agreed with plan.

## 2013-10-24 NOTE — Telephone Encounter (Signed)
I spoke with the pt. He is still having the same pain in his stomach. I informed him that this was a new problem and that he was going to need an office visit. He agreed with plan.  Manuela Schwartz, please schedule pt ov.

## 2013-10-25 ENCOUNTER — Encounter: Payer: Self-pay | Admitting: Internal Medicine

## 2013-10-25 NOTE — Telephone Encounter (Signed)
I put patient in next available with [facility] for 7-28 at 11 and appt card mailed

## 2013-11-28 ENCOUNTER — Ambulatory Visit: Payer: BC Managed Care – PPO | Admitting: Gastroenterology
